# Patient Record
Sex: Female | Born: 1965 | Race: White | Hispanic: No | Marital: Married
Health system: Southern US, Community
[De-identification: ages and names within clinical notes are randomized; demographics above are authoritative.]

---

## 2021-01-07 ENCOUNTER — Emergency Department: Payer: 59

## 2021-01-07 ENCOUNTER — Other Ambulatory Visit: Payer: Self-pay

## 2021-01-07 DIAGNOSIS — I491 Atrial premature depolarization: Secondary | ICD-10-CM | POA: Diagnosis not present

## 2021-01-07 DIAGNOSIS — R946 Abnormal results of thyroid function studies: Secondary | ICD-10-CM | POA: Insufficient documentation

## 2021-01-07 DIAGNOSIS — R002 Palpitations: Secondary | ICD-10-CM | POA: Diagnosis present

## 2021-01-07 LAB — CBC
HCT: 38.2 % (ref 36.0–46.0)
Hemoglobin: 13.3 g/dL (ref 12.0–15.0)
MCH: 32.1 pg (ref 26.0–34.0)
MCHC: 34.8 g/dL (ref 30.0–36.0)
MCV: 92.3 fL (ref 80.0–100.0)
Platelets: 249 10*3/uL (ref 150–400)
RBC: 4.14 MIL/uL (ref 3.87–5.11)
RDW: 12.5 % (ref 11.5–15.5)
WBC: 8.4 10*3/uL (ref 4.0–10.5)
nRBC: 0 % (ref 0.0–0.2)

## 2021-01-07 NOTE — ED Triage Notes (Signed)
Pt states days of feeling palpitations and chest pressure. Pt states she has been lightheaded and dizzy. Pt states it is intermittent. Pt states when she has palpitations she does not notice that her heart rate is fast.

## 2021-01-08 ENCOUNTER — Emergency Department
Admission: EM | Admit: 2021-01-08 | Discharge: 2021-01-08 | Disposition: A | Discharge status: Home / Self Care | Payer: 59 | Attending: Emergency Medicine | Admitting: Emergency Medicine

## 2021-01-08 DIAGNOSIS — R002 Palpitations: Secondary | ICD-10-CM

## 2021-01-08 DIAGNOSIS — I491 Atrial premature depolarization: Secondary | ICD-10-CM

## 2021-01-08 DIAGNOSIS — R7989 Other specified abnormal findings of blood chemistry: Secondary | ICD-10-CM

## 2021-01-08 LAB — BASIC METABOLIC PANEL
Anion gap: 8 (ref 5–15)
BUN: 20 mg/dL (ref 6–20)
CO2: 26 mmol/L (ref 22–32)
Calcium: 9.5 mg/dL (ref 8.9–10.3)
Chloride: 104 mmol/L (ref 98–111)
Creatinine, Ser: 0.95 mg/dL (ref 0.44–1.00)
GFR, Estimated: >60 mL/min (ref >60)
Glucose, Bld: 116 mg/dL — ABNORMAL HIGH (ref 70–99)
Potassium: 4.1 mmol/L (ref 3.5–5.1)
Sodium: 138 mmol/L (ref 135–145)

## 2021-01-08 LAB — TSH: TSH: 10.432 u[IU]/mL — ABNORMAL HIGH (ref 0.350–4.500)

## 2021-01-08 LAB — TROPONIN I (HIGH SENSITIVITY)
Troponin I (High Sensitivity): 3 ng/L (ref <18)
Troponin I (High Sensitivity): 3 ng/L (ref <18)

## 2021-01-08 LAB — MAGNESIUM: Magnesium: 2.2 mg/dL (ref 1.7–2.4)

## 2021-01-08 LAB — T4, FREE: Free T4: 0.82 ng/dL (ref 0.61–1.12)

## 2021-01-08 NOTE — ED Notes (Signed)
Patient reports having palpitations for several days, states it doesn't feel like heart is racing but that it is beating really hard, only accompanying symptom is occasional dizziness that patient describes and things are just off.

## 2021-01-08 NOTE — ED Provider Notes (Signed)
[facility] Emergency Department Provider Note  ____________________________________________  Time seen: Approximately 3:05 AM  I have reviewed the triage vital signs and the nursing notes.   HISTORY  Chief Complaint Palpitations and Chest Pain   HPI Jane Herrera is a 55 y.o. female with no significant past medical history who presents for evaluation of palpitations.   Patient reports that she has been having the symptoms for several months but has not seek care.  She describes as a sensation of her heart pounding in her chest.  She reports that the heart does not beat fast just very strong.  Usually happens at nighttime while she is laying down to go to sleep.  It may last several hours.  She reports that recently has been happening almost on a nightly basis and it was pretty severe last night and tonight.  She reports that now she has some pain in her chest associated with it.  She reports that she feels like a heavy bag laying on top of her chest.  She also feels some pressure in her throat but not really shortness of breath.  During the day she says that sometimes she can have these episodes but they are usually more mild.  She denies any personal or family history of PE or DVT, recent travel immobilization, leg pain or swelling, hemoptysis, or exogenous hormones.  No cough or fever.  Patient is not a smoker.  She does report that she drinks a lot of caffeine on a daily basis.  Denies any alcohol use.  No history of smoking.  Denies any family history of cardiac dysrhythmias.  Mother does have a history of coronary artery disease. No syncope or dizziness. She does not feel that the symptoms are associated with her feeling stressed but her husband says that last weekend he was away with their kids and patient had a whole weekend just to herself in a very long time. Patient told him that she had a great weekend with none of the symptoms.  PMH None -  reviewed  Allergies Sulfa antibiotics and Xanax [alprazolam]  No family history on file.  Social History    Review of Systems  Constitutional: Negative for fever. Eyes: Negative for visual changes. ENT: Negative for sore throat. Neck: No neck pain  Cardiovascular: Negative for chest pain. + palpitations and heaviness of the chest Respiratory: Negative for shortness of breath. Gastrointestinal: Negative for abdominal pain, vomiting or diarrhea. Genitourinary: Negative for dysuria. Musculoskeletal: Negative for back pain. Skin: Negative for rash. Neurological: Negative for headaches, weakness or numbness. Psych: No SI or HI  ____________________________________________   PHYSICAL EXAM:  VITAL SIGNS: ED Triage Vitals  Enc Vitals Group     BP 01/07/21 2328 (!) 156/81     Pulse Rate 01/07/21 2328 66     Resp 01/07/21 2327 18     Temp 01/07/21 2328 98.6 F (37 C)     Temp Source 01/07/21 2328 Oral     SpO2 01/07/21 2328 99 %     Weight 01/07/21 2327 160 lb (72.6 kg)     Height 01/07/21 2327 5\' 4"  (1.626 m)     Head Circumference --      Peak Flow --      Pain Score --      Pain Loc --      Pain Edu? --      Excl. in GC? --     Constitutional: Alert and oriented. Well appearing and in  no apparent distress. HEENT:      Head: Normocephalic and atraumatic.         Eyes: Conjunctivae are normal. Sclera is non-icteric.       Mouth/Throat: Mucous membranes are moist.       Neck: Supple with no signs of meningismus. Cardiovascular: Regular rate and rhythm. No murmurs, gallops, or rubs. 2+ symmetrical distal pulses are present in all extremities. No JVD. Respiratory: Normal respiratory effort. Lungs are clear to auscultation bilaterally.  Gastrointestinal: Soft, non tender, and non distended. Musculoskeletal:  No edema, cyanosis, or erythema of extremities. Neurologic: Normal speech and language. Face is symmetric. Moving all extremities. No gross focal neurologic  deficits are appreciated. Skin: Skin is warm, dry and intact. No rash noted. Psychiatric: Mood and affect are normal. Speech and behavior are normal.  ____________________________________________   LABS (all labs ordered are listed, but only abnormal results are displayed)  Labs Reviewed  BASIC METABOLIC PANEL - Abnormal; Notable for the following components:      Result Value   Glucose, Bld 116 (*)    All other components within normal limits  TSH - Abnormal; Notable for the following components:   TSH 10.432 (*)    All other components within normal limits  CBC  MAGNESIUM  T4, FREE  TROPONIN I (HIGH SENSITIVITY)  TROPONIN I (HIGH SENSITIVITY)   ____________________________________________  EKG  ED ECG REPORT I, Nita Sickle, the attending physician, personally viewed and interpreted this ECG.  Sinus rhythm with frequent PACs, first-degree AV block, no ST elevations or depressions ____________________________________________  RADIOLOGY  I have personally reviewed the images performed during this visit and I agree with the Radiologist's read.   Interpretation by Radiologist:  DG Chest 2 View  Result Date: 01/07/2021 CLINICAL DATA:  Palpitations and chest pressure. EXAM: CHEST - 2 VIEW COMPARISON:  None. FINDINGS: The heart size and mediastinal contours are within normal limits. Both lungs are clear. The visualized skeletal structures are unremarkable. IMPRESSION: No active cardiopulmonary disease. Electronically Signed   By: Aram Candela M.D.   On: 01/07/2021 23:48     ____________________________________________   PROCEDURES  Procedure(s) performed:yes .1-3 Lead EKG Interpretation Performed by: Nita Sickle, MD Authorized by: Nita Sickle, MD     Interpretation: non-specific     ECG rate assessment: normal     Rhythm: sinus rhythm     Ectopy: PAC     Conduction: normal     Critical Care performed:   None ____________________________________________   INITIAL IMPRESSION / ASSESSMENT AND PLAN / ED COURSE  55 y.o. female with no significant past medical history who presents for evaluation of palpitations that she describes as feeling like her heart is pounding in her chest but not beating fast in the normal.  Associated with chest heaviness and some throat heaviness.  Patient is well-appearing and in no distress.  On telemetry she has frequent PACs.  EKG showing also frequent PACs with first-degree AV block.  No electrolyte derangements with normal K of 4.1, normal mag of 2.2.  No signs of cardiac ischemia both on EKG and with 2 high-sensitivity troponins.  No signs of dehydration, no signs of anemia.  TSH is very high at 10.432, free T4 is pending.  Plan to monitor patient on telemetry here and if no dysrhythmias were observed will discharge home with follow-up with cardiologist for Holter monitoring.  Differential diagnoses including dysrhythmias such as A. fib, a flutter, V. tach, PACs, PVCs versus anxiety/stress.  Low suspicion for PE  with no tachypnea, tachycardia, hypoxia, and intermittent symptoms.  History gathered from patient and her husband was at bedside.  Plan discussed with both of them.   _________________________ 3:40 AM on 01/08/2021 -----------------------------------------  Free T4 is normal.  Telemetry monitoring was reviewed showing several PACs but no other dysrhythmias.  At this time will recommend follow-up with cardiology for Holter monitoring.  Discussed elevated TSH with normal free T4 with patient recommended follow-up with her PCP for that.  Discussed my return precautions for any new or worsening palpitations, chest pain, or dizziness/syncope.    _____________________________________________ Please note:  Patient was evaluated in Emergency Department today for the symptoms described in the history of present illness. Patient was evaluated in the context of the global  COVID-19 pandemic, which necessitated consideration that the patient might be at risk for infection with the SARS-CoV-2 virus that causes COVID-19. Institutional protocols and algorithms that pertain to the evaluation of patients at risk for COVID-19 are in a state of rapid change based on information released by regulatory bodies including the CDC and federal and state organizations. These policies and algorithms were followed during the patient's care in the ED.  Some ED evaluations and interventions may be delayed as a result of limited staffing during the pandemic.   [place] Controlled Substance Database was reviewed by me. ____________________________________________   FINAL CLINICAL IMPRESSION(S) / ED DIAGNOSES   Final diagnoses:  Palpitations  PAC (premature atrial contraction)  Elevated TSH      NEW MEDICATIONS STARTED DURING THIS VISIT:  ED Discharge Orders    None       Note:  This document was prepared using Dragon voice recognition software and may include unintentional dictation errors.    Don Perking, [place], MD 01/08/21 [phone]

## 2021-01-08 NOTE — Discharge Instructions (Signed)
As we discussed your TSH hormone levels were slightly elevated but your thyroid hormones were normal.  Make sure to discuss that with your primary care doctor so he can be followed for that.  I am also recommending that you follow-up with a cardiologist as soon as possible for Holter monitoring to make sure that you are not having any malignant dysrhythmias.  In the meantime if you have worsening palpitations, if you feel dizzy or pass out while having palpitations or if you develop chest pain please return to the emergency room

## 2021-01-18 ENCOUNTER — Ambulatory Visit: Payer: 59 | Admitting: Nurse Practitioner

## 2021-05-31 ENCOUNTER — Ambulatory Visit: Payer: 59 | Admitting: Family Medicine

## 2021-07-06 ENCOUNTER — Other Ambulatory Visit: Payer: Self-pay | Admitting: Internal Medicine

## 2021-07-06 DIAGNOSIS — Z1231 Encounter for screening mammogram for malignant neoplasm of breast: Secondary | ICD-10-CM

## 2021-09-06 ENCOUNTER — Ambulatory Visit
Admission: RE | Admit: 2021-09-06 | Discharge: 2021-09-06 | Disposition: A | Discharge status: Home / Self Care | Payer: 59 | Source: Ambulatory Visit | Attending: Internal Medicine | Admitting: Internal Medicine

## 2021-09-06 ENCOUNTER — Other Ambulatory Visit: Payer: Self-pay

## 2021-09-06 DIAGNOSIS — Z1231 Encounter for screening mammogram for malignant neoplasm of breast: Secondary | ICD-10-CM | POA: Diagnosis not present

## 2021-09-07 ENCOUNTER — Inpatient Hospital Stay
Admission: RE | Admit: 2021-09-07 | Discharge: 2021-09-07 | Disposition: A | Discharge status: Home / Self Care | Payer: Self-pay | Source: Ambulatory Visit | Attending: *Deleted | Admitting: *Deleted

## 2021-09-07 ENCOUNTER — Other Ambulatory Visit: Payer: Self-pay | Admitting: *Deleted

## 2021-09-07 DIAGNOSIS — Z1231 Encounter for screening mammogram for malignant neoplasm of breast: Secondary | ICD-10-CM

## 2021-09-26 DIAGNOSIS — N816 Rectocele: Secondary | ICD-10-CM | POA: Diagnosis not present

## 2021-09-26 DIAGNOSIS — N814 Uterovaginal prolapse, unspecified: Secondary | ICD-10-CM | POA: Diagnosis not present

## 2021-09-26 DIAGNOSIS — N3941 Urge incontinence: Secondary | ICD-10-CM | POA: Diagnosis not present

## 2021-09-26 DIAGNOSIS — Z124 Encounter for screening for malignant neoplasm of cervix: Secondary | ICD-10-CM | POA: Diagnosis not present

## 2021-10-05 DIAGNOSIS — E039 Hypothyroidism, unspecified: Secondary | ICD-10-CM | POA: Diagnosis not present

## 2021-10-16 DIAGNOSIS — Z780 Asymptomatic menopausal state: Secondary | ICD-10-CM | POA: Diagnosis not present

## 2021-10-16 DIAGNOSIS — E7800 Pure hypercholesterolemia, unspecified: Secondary | ICD-10-CM | POA: Diagnosis not present

## 2021-10-16 DIAGNOSIS — R053 Chronic cough: Secondary | ICD-10-CM | POA: Diagnosis not present

## 2021-10-16 DIAGNOSIS — E039 Hypothyroidism, unspecified: Secondary | ICD-10-CM | POA: Diagnosis not present

## 2021-11-12 DIAGNOSIS — N8111 Cystocele, midline: Secondary | ICD-10-CM | POA: Diagnosis not present

## 2021-11-12 DIAGNOSIS — N3946 Mixed incontinence: Secondary | ICD-10-CM | POA: Diagnosis not present

## 2021-11-12 DIAGNOSIS — N816 Rectocele: Secondary | ICD-10-CM | POA: Diagnosis not present

## 2021-12-10 DIAGNOSIS — M5440 Lumbago with sciatica, unspecified side: Secondary | ICD-10-CM | POA: Diagnosis not present

## 2021-12-10 DIAGNOSIS — M5431 Sciatica, right side: Secondary | ICD-10-CM | POA: Diagnosis not present

## 2021-12-10 DIAGNOSIS — M5441 Lumbago with sciatica, right side: Secondary | ICD-10-CM | POA: Diagnosis not present

## 2021-12-10 DIAGNOSIS — M533 Sacrococcygeal disorders, not elsewhere classified: Secondary | ICD-10-CM | POA: Diagnosis not present

## 2021-12-10 DIAGNOSIS — M47816 Spondylosis without myelopathy or radiculopathy, lumbar region: Secondary | ICD-10-CM | POA: Diagnosis not present

## 2021-12-20 DIAGNOSIS — M5431 Sciatica, right side: Secondary | ICD-10-CM | POA: Diagnosis not present

## 2022-02-20 DIAGNOSIS — H8112 Benign paroxysmal vertigo, left ear: Secondary | ICD-10-CM | POA: Diagnosis not present

## 2022-02-25 DIAGNOSIS — N3946 Mixed incontinence: Secondary | ICD-10-CM | POA: Diagnosis not present

## 2022-03-02 DIAGNOSIS — N390 Urinary tract infection, site not specified: Secondary | ICD-10-CM | POA: Diagnosis not present

## 2022-03-02 DIAGNOSIS — R8290 Unspecified abnormal findings in urine: Secondary | ICD-10-CM | POA: Diagnosis not present

## 2022-03-02 DIAGNOSIS — R399 Unspecified symptoms and signs involving the genitourinary system: Secondary | ICD-10-CM | POA: Diagnosis not present

## 2022-03-02 DIAGNOSIS — R300 Dysuria: Secondary | ICD-10-CM | POA: Diagnosis not present

## 2022-03-02 DIAGNOSIS — R319 Hematuria, unspecified: Secondary | ICD-10-CM | POA: Diagnosis not present

## 2022-07-12 DIAGNOSIS — Z780 Asymptomatic menopausal state: Secondary | ICD-10-CM | POA: Diagnosis not present

## 2022-07-12 DIAGNOSIS — E7800 Pure hypercholesterolemia, unspecified: Secondary | ICD-10-CM | POA: Diagnosis not present

## 2022-07-12 DIAGNOSIS — E039 Hypothyroidism, unspecified: Secondary | ICD-10-CM | POA: Diagnosis not present

## 2022-07-19 DIAGNOSIS — M542 Cervicalgia: Secondary | ICD-10-CM | POA: Diagnosis not present

## 2022-07-19 DIAGNOSIS — M5441 Lumbago with sciatica, right side: Secondary | ICD-10-CM | POA: Diagnosis not present

## 2022-07-19 DIAGNOSIS — E7800 Pure hypercholesterolemia, unspecified: Secondary | ICD-10-CM | POA: Diagnosis not present

## 2022-07-19 DIAGNOSIS — E039 Hypothyroidism, unspecified: Secondary | ICD-10-CM | POA: Diagnosis not present

## 2022-07-19 DIAGNOSIS — G8929 Other chronic pain: Secondary | ICD-10-CM | POA: Diagnosis not present

## 2022-07-19 DIAGNOSIS — Z0000 Encounter for general adult medical examination without abnormal findings: Secondary | ICD-10-CM | POA: Diagnosis not present

## 2022-07-19 DIAGNOSIS — Z780 Asymptomatic menopausal state: Secondary | ICD-10-CM | POA: Diagnosis not present

## 2022-08-02 DIAGNOSIS — H18513 Endothelial corneal dystrophy, bilateral: Secondary | ICD-10-CM | POA: Diagnosis not present

## 2022-08-26 DIAGNOSIS — R768 Other specified abnormal immunological findings in serum: Secondary | ICD-10-CM | POA: Diagnosis not present

## 2022-10-28 DIAGNOSIS — H04123 Dry eye syndrome of bilateral lacrimal glands: Secondary | ICD-10-CM | POA: Diagnosis not present

## 2022-10-28 DIAGNOSIS — R768 Other specified abnormal immunological findings in serum: Secondary | ICD-10-CM | POA: Diagnosis not present

## 2022-10-28 DIAGNOSIS — Z832 Family history of diseases of the blood and blood-forming organs and certain disorders involving the immune mechanism: Secondary | ICD-10-CM | POA: Diagnosis not present

## 2022-10-28 DIAGNOSIS — M2550 Pain in unspecified joint: Secondary | ICD-10-CM | POA: Diagnosis not present

## 2022-10-28 DIAGNOSIS — R5382 Chronic fatigue, unspecified: Secondary | ICD-10-CM | POA: Diagnosis not present

## 2022-10-28 DIAGNOSIS — R682 Dry mouth, unspecified: Secondary | ICD-10-CM | POA: Diagnosis not present

## 2022-10-28 DIAGNOSIS — R21 Rash and other nonspecific skin eruption: Secondary | ICD-10-CM | POA: Diagnosis not present

## 2022-12-09 DIAGNOSIS — M2550 Pain in unspecified joint: Secondary | ICD-10-CM | POA: Diagnosis not present

## 2022-12-09 DIAGNOSIS — M329 Systemic lupus erythematosus, unspecified: Secondary | ICD-10-CM | POA: Diagnosis not present

## 2022-12-09 DIAGNOSIS — E039 Hypothyroidism, unspecified: Secondary | ICD-10-CM | POA: Diagnosis not present

## 2022-12-09 DIAGNOSIS — R768 Other specified abnormal immunological findings in serum: Secondary | ICD-10-CM | POA: Diagnosis not present

## 2023-02-26 DIAGNOSIS — L400 Psoriasis vulgaris: Secondary | ICD-10-CM | POA: Diagnosis not present

## 2023-02-26 DIAGNOSIS — L603 Nail dystrophy: Secondary | ICD-10-CM | POA: Diagnosis not present

## 2023-02-27 DIAGNOSIS — M359 Systemic involvement of connective tissue, unspecified: Secondary | ICD-10-CM | POA: Diagnosis not present

## 2023-02-27 DIAGNOSIS — E039 Hypothyroidism, unspecified: Secondary | ICD-10-CM | POA: Diagnosis not present

## 2023-02-27 DIAGNOSIS — M064 Inflammatory polyarthropathy: Secondary | ICD-10-CM | POA: Diagnosis not present

## 2023-04-15 ENCOUNTER — Other Ambulatory Visit: Payer: Self-pay | Admitting: Internal Medicine

## 2023-04-15 DIAGNOSIS — Z1211 Encounter for screening for malignant neoplasm of colon: Secondary | ICD-10-CM | POA: Diagnosis not present

## 2023-04-15 DIAGNOSIS — Z1231 Encounter for screening mammogram for malignant neoplasm of breast: Secondary | ICD-10-CM | POA: Diagnosis not present

## 2023-04-15 DIAGNOSIS — E7800 Pure hypercholesterolemia, unspecified: Secondary | ICD-10-CM | POA: Diagnosis not present

## 2023-04-15 DIAGNOSIS — L4050 Arthropathic psoriasis, unspecified: Secondary | ICD-10-CM | POA: Diagnosis not present

## 2023-04-15 DIAGNOSIS — E039 Hypothyroidism, unspecified: Secondary | ICD-10-CM | POA: Diagnosis not present

## 2023-04-16 DIAGNOSIS — L400 Psoriasis vulgaris: Secondary | ICD-10-CM | POA: Diagnosis not present

## 2023-04-16 DIAGNOSIS — R21 Rash and other nonspecific skin eruption: Secondary | ICD-10-CM | POA: Diagnosis not present

## 2023-04-16 DIAGNOSIS — L308 Other specified dermatitis: Secondary | ICD-10-CM | POA: Diagnosis not present

## 2023-04-18 DIAGNOSIS — Z79899 Other long term (current) drug therapy: Secondary | ICD-10-CM | POA: Diagnosis not present

## 2023-04-18 DIAGNOSIS — L4050 Arthropathic psoriasis, unspecified: Secondary | ICD-10-CM | POA: Diagnosis not present

## 2023-05-02 DIAGNOSIS — R899 Unspecified abnormal finding in specimens from other organs, systems and tissues: Secondary | ICD-10-CM | POA: Diagnosis not present

## 2023-05-06 LAB — EXTERNAL GENERIC LAB PROCEDURE

## 2023-05-23 DIAGNOSIS — R899 Unspecified abnormal finding in specimens from other organs, systems and tissues: Secondary | ICD-10-CM | POA: Diagnosis not present

## 2023-05-23 DIAGNOSIS — R748 Abnormal levels of other serum enzymes: Secondary | ICD-10-CM | POA: Diagnosis not present

## 2023-05-28 DIAGNOSIS — Z1211 Encounter for screening for malignant neoplasm of colon: Secondary | ICD-10-CM | POA: Diagnosis not present

## 2023-05-29 ENCOUNTER — Ambulatory Visit
Admission: RE | Admit: 2023-05-29 | Discharge: 2023-05-29 | Disposition: A | Discharge status: Home / Self Care | Payer: 59 | Source: Ambulatory Visit | Attending: Internal Medicine | Admitting: Internal Medicine

## 2023-05-29 DIAGNOSIS — Z1231 Encounter for screening mammogram for malignant neoplasm of breast: Secondary | ICD-10-CM | POA: Diagnosis not present

## 2023-06-06 DIAGNOSIS — M064 Inflammatory polyarthropathy: Secondary | ICD-10-CM | POA: Diagnosis not present

## 2023-06-06 LAB — COLOGUARD: COLOGUARD: NEGATIVE

## 2023-07-02 IMAGING — MG MM DIGITAL SCREENING BILAT W/ TOMO AND CAD
8 series · 8 of 24 positions shown · non-contrast
Comparison: Previous exam(s).

CLINICAL DATA: Screening.

EXAM:
DIGITAL SCREENING BILATERAL MAMMOGRAM WITH TOMOSYNTHESIS AND CAD
TECHNIQUE: Bilateral screening digital craniocaudal and mediolateral oblique
mammograms were obtained. Bilateral screening digital breast
tomosynthesis was performed. The images were evaluated with
computer-aided detection.

[R MLO synth-2D]
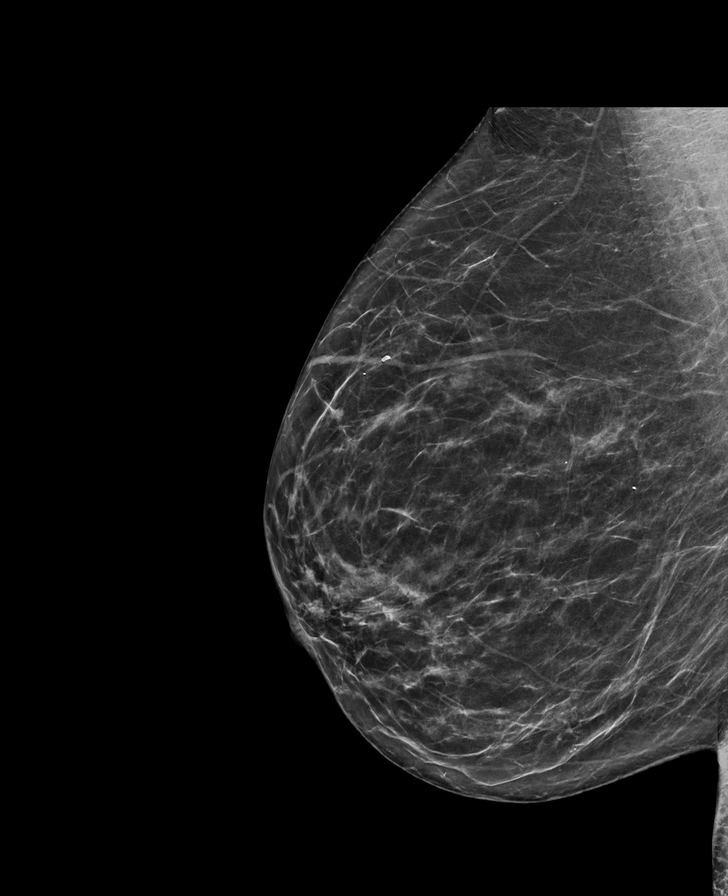

[L CC synth-2D]
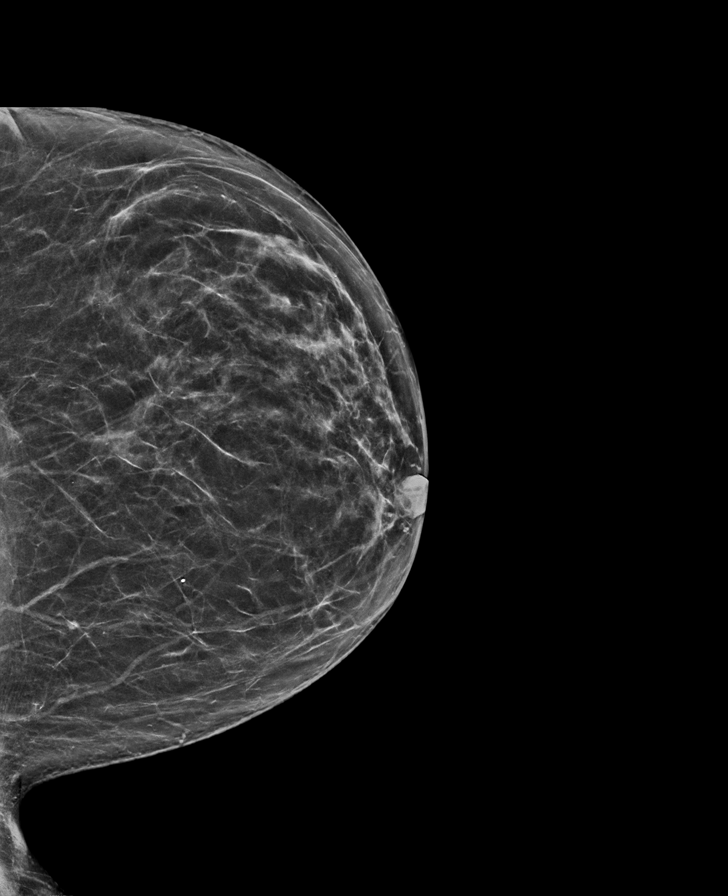

[L MLO synth-2D]
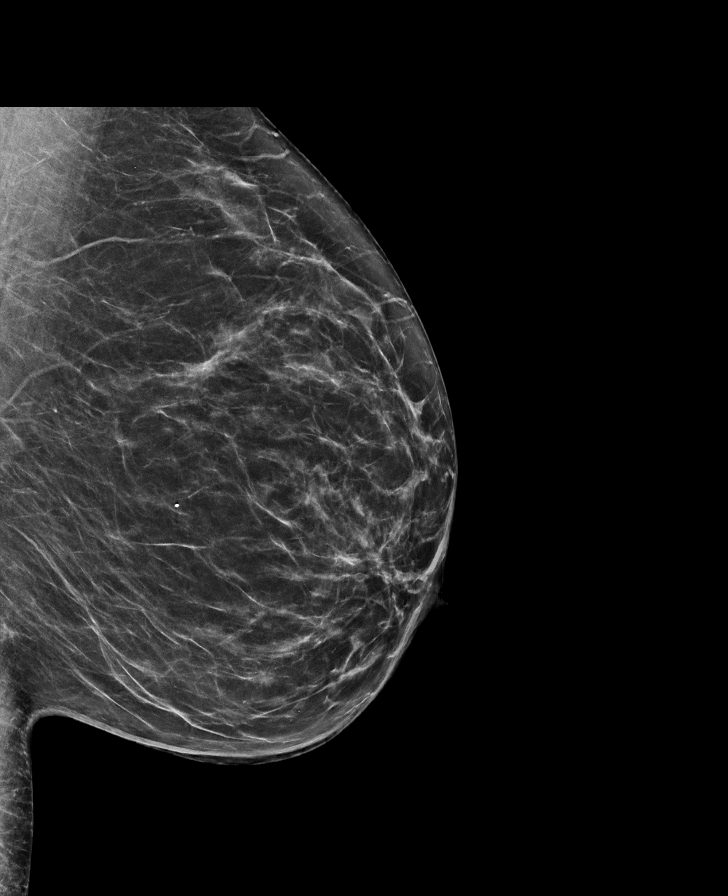

[R CC synth-2D]
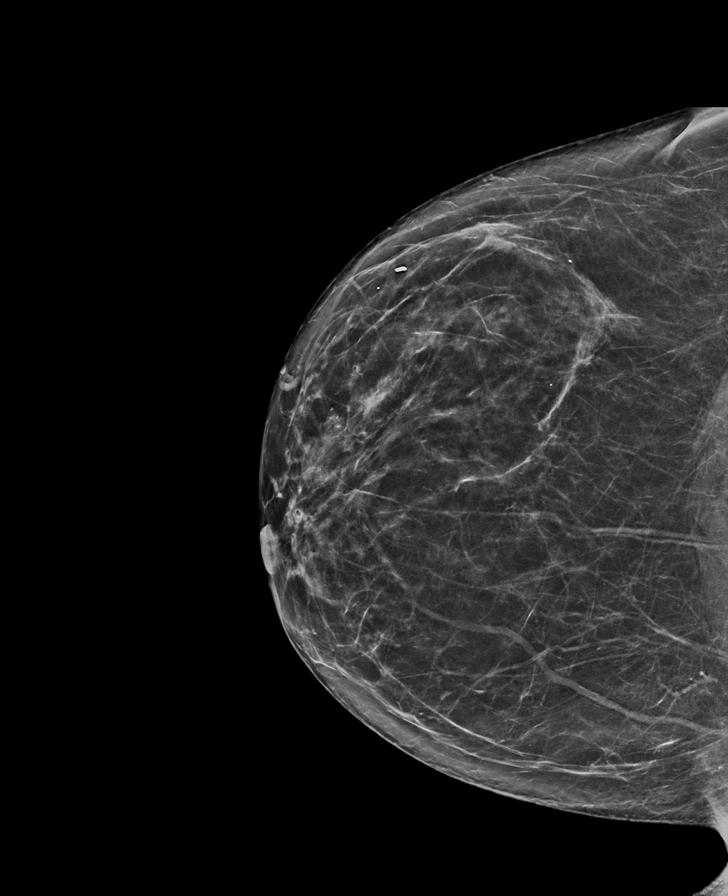

[L CC tomo · tomo slice 35/70.0]
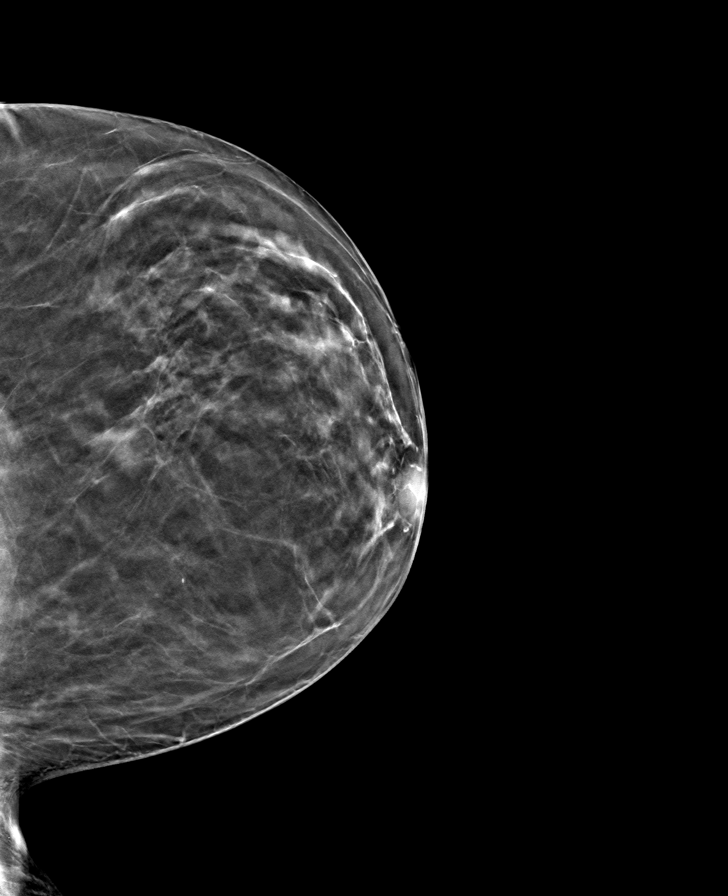

[R MLO tomo · tomo slice 37/72.0]
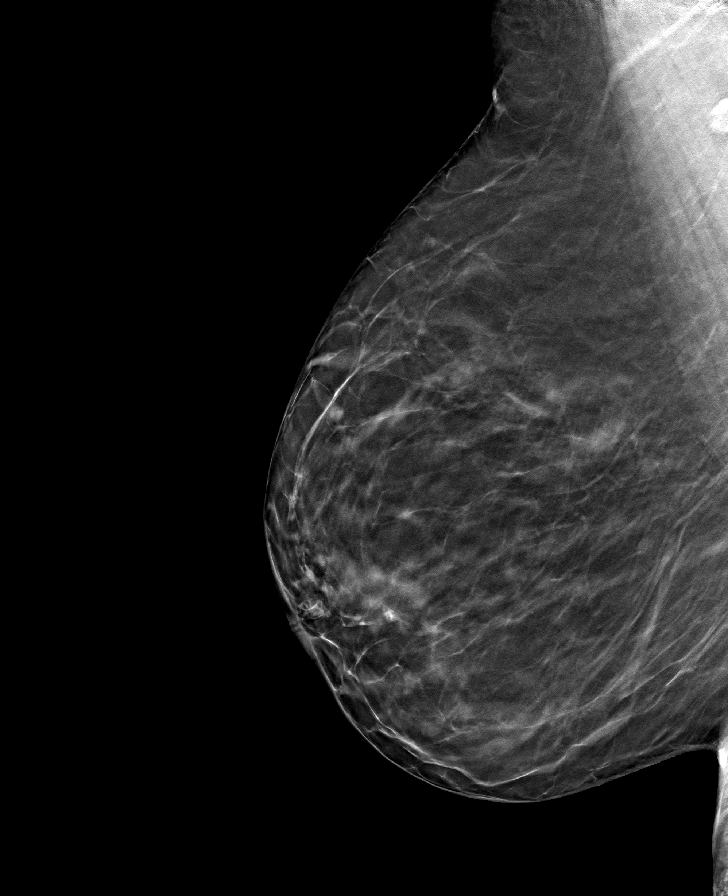

[L MLO tomo · tomo slice 37/72.0]
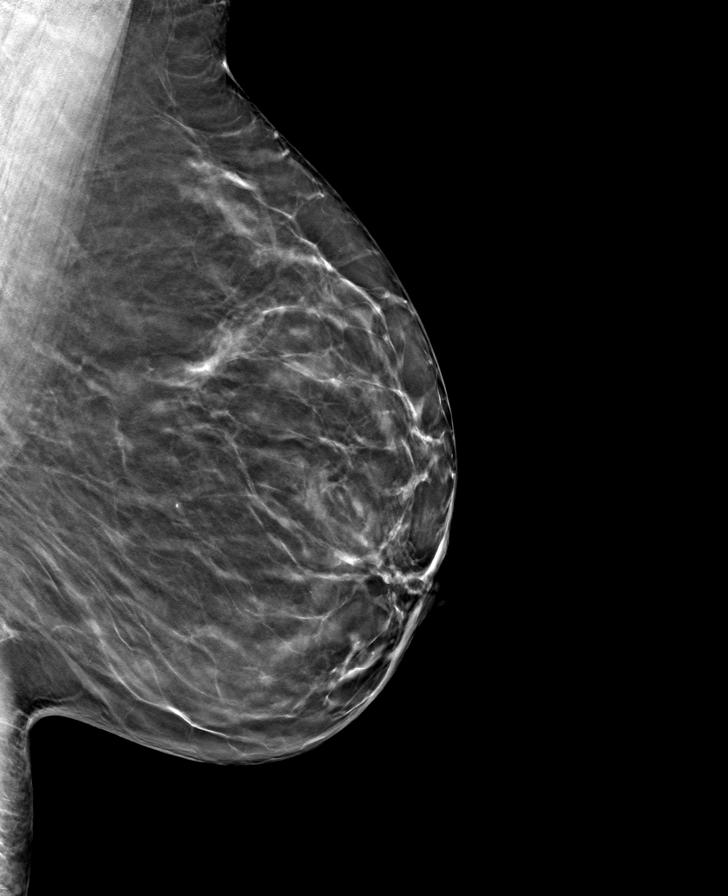

[R CC tomo · tomo slice 35/69.0]
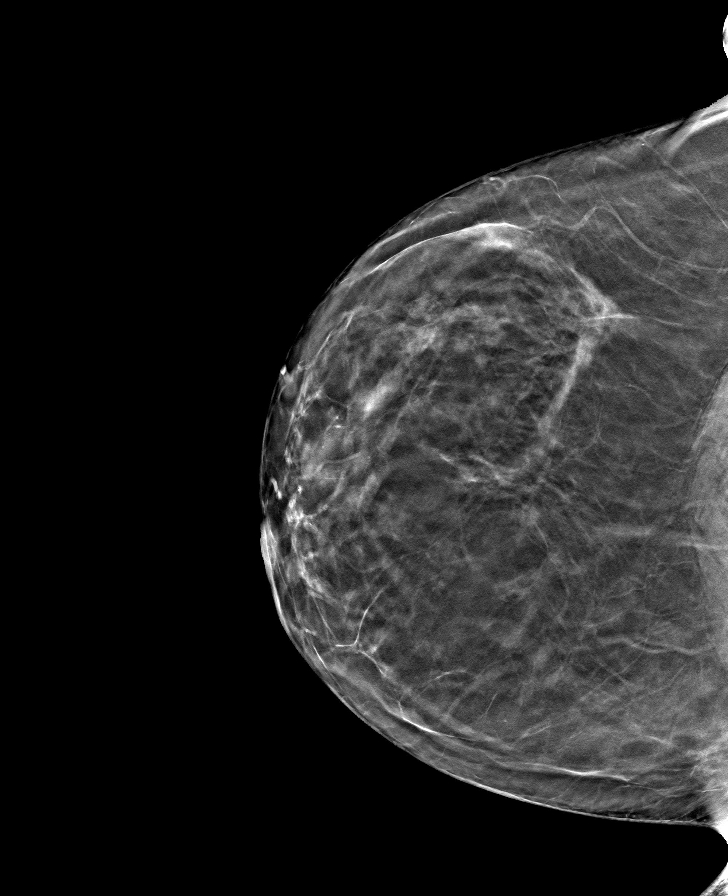

[8 of 24 positions shown; findings below may reference images not displayed]

ACR Breast Density Category b: There are scattered areas of
fibroglandular density.
FINDINGS: There are no findings suspicious for malignancy.
IMPRESSION: No mammographic evidence of malignancy. A result letter of this
screening mammogram will be mailed directly to the patient.

RECOMMENDATION:
Screening mammogram in one year. (Code:51-O-LD2)

BI-RADS CATEGORY  1: Negative.

## 2023-07-23 DIAGNOSIS — L4050 Arthropathic psoriasis, unspecified: Secondary | ICD-10-CM | POA: Diagnosis not present

## 2023-07-23 DIAGNOSIS — Z79899 Other long term (current) drug therapy: Secondary | ICD-10-CM | POA: Diagnosis not present

## 2023-07-23 DIAGNOSIS — R768 Other specified abnormal immunological findings in serum: Secondary | ICD-10-CM | POA: Diagnosis not present

## 2023-08-08 DIAGNOSIS — R899 Unspecified abnormal finding in specimens from other organs, systems and tissues: Secondary | ICD-10-CM | POA: Diagnosis not present

## 2023-09-16 DIAGNOSIS — M2550 Pain in unspecified joint: Secondary | ICD-10-CM | POA: Diagnosis not present

## 2023-09-16 DIAGNOSIS — Z0000 Encounter for general adult medical examination without abnormal findings: Secondary | ICD-10-CM | POA: Diagnosis not present

## 2023-09-16 DIAGNOSIS — L4050 Arthropathic psoriasis, unspecified: Secondary | ICD-10-CM | POA: Diagnosis not present

## 2023-09-16 DIAGNOSIS — E039 Hypothyroidism, unspecified: Secondary | ICD-10-CM | POA: Diagnosis not present

## 2023-09-16 DIAGNOSIS — E7800 Pure hypercholesterolemia, unspecified: Secondary | ICD-10-CM | POA: Diagnosis not present

## 2023-10-03 DIAGNOSIS — H2512 Age-related nuclear cataract, left eye: Secondary | ICD-10-CM | POA: Diagnosis not present

## 2023-10-03 DIAGNOSIS — H2511 Age-related nuclear cataract, right eye: Secondary | ICD-10-CM | POA: Diagnosis not present

## 2023-10-03 DIAGNOSIS — H04129 Dry eye syndrome of unspecified lacrimal gland: Secondary | ICD-10-CM | POA: Diagnosis not present

## 2023-10-03 DIAGNOSIS — H18513 Endothelial corneal dystrophy, bilateral: Secondary | ICD-10-CM | POA: Diagnosis not present

## 2023-10-17 DIAGNOSIS — L4050 Arthropathic psoriasis, unspecified: Secondary | ICD-10-CM | POA: Diagnosis not present

## 2023-10-17 DIAGNOSIS — E039 Hypothyroidism, unspecified: Secondary | ICD-10-CM | POA: Diagnosis not present

## 2023-10-17 DIAGNOSIS — Z0000 Encounter for general adult medical examination without abnormal findings: Secondary | ICD-10-CM | POA: Diagnosis not present

## 2023-10-17 DIAGNOSIS — M2550 Pain in unspecified joint: Secondary | ICD-10-CM | POA: Diagnosis not present

## 2023-10-17 DIAGNOSIS — Z789 Other specified health status: Secondary | ICD-10-CM | POA: Diagnosis not present

## 2023-10-23 DIAGNOSIS — L4050 Arthropathic psoriasis, unspecified: Secondary | ICD-10-CM | POA: Diagnosis not present

## 2023-10-23 DIAGNOSIS — Z79899 Other long term (current) drug therapy: Secondary | ICD-10-CM | POA: Diagnosis not present

## 2023-10-24 DIAGNOSIS — H25813 Combined forms of age-related cataract, bilateral: Secondary | ICD-10-CM | POA: Diagnosis not present

## 2023-10-24 DIAGNOSIS — H18513 Endothelial corneal dystrophy, bilateral: Secondary | ICD-10-CM | POA: Diagnosis not present

## 2023-12-04 DIAGNOSIS — H18512 Endothelial corneal dystrophy, left eye: Secondary | ICD-10-CM | POA: Diagnosis not present

## 2023-12-04 DIAGNOSIS — H2512 Age-related nuclear cataract, left eye: Secondary | ICD-10-CM | POA: Diagnosis not present

## 2023-12-04 DIAGNOSIS — H1820 Unspecified corneal edema: Secondary | ICD-10-CM | POA: Diagnosis not present

## 2024-01-06 DIAGNOSIS — Z79899 Other long term (current) drug therapy: Secondary | ICD-10-CM | POA: Diagnosis not present

## 2024-01-06 DIAGNOSIS — L4050 Arthropathic psoriasis, unspecified: Secondary | ICD-10-CM | POA: Diagnosis not present

## 2024-01-27 DIAGNOSIS — H25813 Combined forms of age-related cataract, bilateral: Secondary | ICD-10-CM | POA: Diagnosis not present

## 2024-01-27 DIAGNOSIS — H40042 Steroid responder, left eye: Secondary | ICD-10-CM | POA: Diagnosis not present

## 2024-01-27 DIAGNOSIS — H18513 Endothelial corneal dystrophy, bilateral: Secondary | ICD-10-CM | POA: Diagnosis not present

## 2024-01-27 DIAGNOSIS — Z947 Corneal transplant status: Secondary | ICD-10-CM | POA: Diagnosis not present

## 2024-02-03 DIAGNOSIS — H18513 Endothelial corneal dystrophy, bilateral: Secondary | ICD-10-CM | POA: Diagnosis not present

## 2024-02-03 DIAGNOSIS — Z947 Corneal transplant status: Secondary | ICD-10-CM | POA: Diagnosis not present

## 2024-02-25 DIAGNOSIS — Z79899 Other long term (current) drug therapy: Secondary | ICD-10-CM | POA: Diagnosis not present

## 2024-02-25 DIAGNOSIS — L4050 Arthropathic psoriasis, unspecified: Secondary | ICD-10-CM | POA: Diagnosis not present

## 2024-03-10 DIAGNOSIS — H18511 Endothelial corneal dystrophy, right eye: Secondary | ICD-10-CM | POA: Diagnosis not present

## 2024-03-10 DIAGNOSIS — H18519 Endothelial corneal dystrophy, unspecified eye: Secondary | ICD-10-CM | POA: Diagnosis not present

## 2024-03-10 DIAGNOSIS — H2511 Age-related nuclear cataract, right eye: Secondary | ICD-10-CM | POA: Diagnosis not present

## 2024-03-10 NOTE — Discharge Summary (Signed)
 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  Hospital Course: Surgery completed.  Discharge Exam:  Physical Exam: BP 113/70 (Patient Position: Sitting)   Temp 36.7 C (98.1 F) (Oral)   Resp 14   Ht 162.6 cm (5' 4)   Wt 70.1 kg (154 lb 8.7 oz)   SpO2 99%   BMI 26.53 kg/m  Constitutional: age appropriate, well developed, well appearing   Discharge Disposition:  Home  Allergies: Allergies  Allergen Reactions  . Alprazolam Other (See Comments)    makes me crazy Patient states this medication Makes me go absolutely crazy    . Sulfa (Sulfonamide Antibiotics) Other (See Comments)    Chills, fever, fatigue high fever     Patient Instructions:    Current Discharge Medication List     CONTINUE taking these medications       Instructions  brimonidine-timoloL 0.2-0.5 % ophthalmic solution Quantity: 5 mL Refills: 11  Commonly known as: COMBIGAN Place 1 drop into the left eye 2 (two) times daily   cholecalciferol 1000 unit capsule Refills: 0  Commonly known as: VITAMIN D3 Take 1,000 Units by mouth once daily   cyanocobalamin 500 MCG tablet Refills: 0  Commonly known as: VITAMIN B12 Take 500 mcg by mouth once daily   EnbreL SureClick 50 mg/mL (1 mL) pen injector Refills: 0 Generic drug: etanercept  Inject 50 mg subcutaneously once a week   ibuprofen 200 MG tablet Refills: 0  Commonly known as: MOTRIN Take 200 mg by mouth every 6 (six) hours as needed for Pain   ketorolac 0.5 % ophthalmic solution Quantity: 5 mL Refills: 0 Stop taking on: March 31, 2024  Commonly known as: Doctor, general practice 1 drop into the left eye 4 (four) times daily for 21 days   levothyroxine 50 MCG tablet Quantity: 90 tablet Refills: 1  Commonly known as: SYNTHROID Take 1 tablet (50 mcg total) by mouth once daily Take on an empty stomach with a glass of water at least 30-60 minutes before breakfast.   MAGNESIUM GLYCINATE ORAL Refills: 0  Take 2 Gum by mouth once daily   MAGNESIUM-POTASSIUM ORAL Refills: 0  Take by mouth   meloxicam 15 MG tablet Quantity: 30 tablet Refills: 1  Commonly known as: MOBIC Take 1 tablet (15 mg total) by mouth once daily   metoprolol TARTrate 25 MG tablet Quantity: 30 tablet Refills: 0  Commonly known as: LOPRESSOR Take 1 tablet (25 mg total) by mouth once daily as needed (palpitations)   naproxen sodium 220 mg Cap Refills: 0  Take by mouth every 8 (eight) hours as needed   ofloxacin 0.3 % ophthalmic solution Quantity: 5 mL Refills: 0  Stop taking on: March 17, 2024  Commonly known as: OCUFLOX Place 1 drop into the left eye 4 (four) times daily for 7 days   prednisoLONE acetate 1 % ophthalmic suspension Quantity: 10 mL Refills: 5  Commonly known as: PRED FORTE Place 1 drop  into the left eye every 1 hour While awake; starting after surgery   prednisoLONE acetate 1 % ophthalmic suspension Quantity: 10 mL Refills: 4  Commonly known as: PRED FORTE Place 1 drop into the left eye 4 (four) times daily       ASK your doctor about these medications      Instructions  diphenhydrAMINE 25 mg capsule Refills: 0  Commonly known as: BENADRYL Take 25 mg by mouth at bedtime as needed for Itching   leflunomide 10 MG tablet Quantity: 30 tablet Refills: 2  Commonly known as: ARAVA Take 1 tablet (10 mg total) by mouth once daily for 90 days   tiZANidine 2 MG tablet Quantity: 180 tablet Refills: 2  Commonly known as: ZANAFLEX Take between 1mg  (half a tablet) and 4mg  (two tablets) up to three times daily as needed for musculoskeletal tension and pain.         Activity:  Light activity. Shield the eye.  Diet: Resume normal diet.  Discharge Condition: Procedure tolerated well and ready for discharge  Results Pending at Discharge:  None  Follow-up Tests Recommended:  None  Follow-up/Transition Care Plan: Post-operative visit tomorrow morning as instructed.   CURTISTINE LOISE MAJESTIC, MD 03/10/2024

## 2024-03-16 DIAGNOSIS — E039 Hypothyroidism, unspecified: Secondary | ICD-10-CM | POA: Diagnosis not present

## 2024-03-16 DIAGNOSIS — L4050 Arthropathic psoriasis, unspecified: Secondary | ICD-10-CM | POA: Diagnosis not present

## 2024-03-16 DIAGNOSIS — E7800 Pure hypercholesterolemia, unspecified: Secondary | ICD-10-CM | POA: Diagnosis not present

## 2024-04-09 NOTE — Progress Notes (Signed)
 Chief Complaint  Patient presents with  . Psoriatic Arthritis    History of present illness:    Jane Herrera is a 58 y.o.female who presents today for follow up of psoriatic arthritis. She was last seen 02/25/2024 and was doing somewhat poorly. We noted mild to moderate breakthrough disease, although marked improvement with enbrel, and retried arava.  Jane Herrera was diagnosed with psoriatic arthritis in 2024 by Will DeFoor, PA-C. Diagnosis was supported by dermatology diagnosed psoriasis, inflammatory arthritis on exam.   Jane Herrera has tried the following rheumatologic medications in the past: - Plaquenil - discontinued in favor of methotrexate before it reached efficacy - Methotrexate - LFT elevation - Arava - current - Unable to take sulfasalazine due to sulfa allergy - Enbrel - current - Tizanidine - helpful prn but causes insomnia  She is doing quite a bit better since starting the arava 10mg  daily, noting she still hurts in a variety of locations but her energy and pain and stiffness have all begun improving. She notes ongoing pains in her hands, feet, elbows, mid-back between her shoulder blades, and her right SI joint area.   Her psoriasis is controlled.          Jane Herrera is currently being treated with enbrel and arava 10mg  daily.  She has not experienced side effects from the medication.  She has not had any infections since last office visit.    She has not had any other significant health changes or unexplained new symptoms since last visit.   Jane Herrera work status is voluntarily unemployed and keeps a hobby farm, previously a [employer]. She is a never smoker. She drinks an average of 0 servings of alcohol weekly. Jane Herrera reports a family history of triple positive antiphospholipid syndrome in her daughter who has a history of miscarriage, but denies any family history of known autoimmune disease otherwise.   Review of Systems ROS was negative except as noted  above.  Patient Active Problem List   Diagnosis Date Noted  . Family history of antiphospholipid syndrome 10/28/2022    Past Medical History:  Diagnosis Date  . Classic migraine   . Hypothyroidism   . PONV (postoperative nausea and vomiting)   . Prolapse of vaginal wall    Past Surgical History:  Procedure Laterality Date  . CORNEAL TRANSPLANT Left 12/04/2023   Procedure: L: DMEK-T -LEFT KERATOPLASTY, DMAEK / DMEK ENDOTHELIAL (CORNEAL TRANSPLANT);  Surgeon: Leonce Franky Seabrook, MD;  Location: [facility];  Service: Ophthalmology;  Laterality: Left;  . EXTRACTION CATARACT EXTRACAPSULAR W/INSERTION INTRAOCULAR PROSTHESIS Left 12/04/2023   Procedure: LEFT EXTRACAPSULAR CATARACT PHACO REMOVAL WITH INSERTION OF INTRAOCULAR LENS PROSTHESIS (1 STAGE PROCEDURE), MANUAL OR MECHANICAL TECHNIQUE; WITHOUT ENDOSCOPIC CYCLOPHOTOCOAGULATION;  Surgeon: Leonce Franky Seabrook, MD;  Location: [facility];  Service: Ophthalmology;  Laterality: Left;  . EXTRACTION CATARACT EXTRACAPSULAR W/INSERTION INTRAOCULAR PROSTHESIS Right 03/10/2024   Procedure: EXTRACTION CATARACT EXTRACAPSULAR WITH PHACO WITH INSERTION INTRAOCULAR PROSTHESIS;  Surgeon: Kuo, Anthony Nanlin, MD;  [facility];  Service: Ophthalmology;  Laterality: Right;  . CORNEAL TRANSPLANT DSAEK Right 03/10/2024   Procedure: KERATOPLASTY, DSAEK ENDOTHELIAL (CORNEAL TRANSPLANT);  Surgeon: Jonda Curtistine Mans, MD;  [facility];  Service: Ophthalmology;  Laterality: Right;  . No Past Surgeries     Social History   Socioeconomic History  . Marital status: Married  . Number of children: 4  Tobacco Use  . Smoking status: Never  . Smokeless tobacco: Never  Vaping Use  . Vaping status: Never Used  Substance  and Sexual Activity  . Alcohol use: Never  . Drug use: Never  . Sexual activity: Yes    Partners: Male   Social Drivers of Health   Financial Resource Strain: Low Risk  (09/16/2023)   Overall Financial Resource Strain  (CARDIA)   . Difficulty of Paying Living Expenses: Not hard at all  Food Insecurity: No Food Insecurity (09/16/2023)   Hunger Vital Sign   . Worried About Programme researcher, broadcasting/film/video in the Last Year: Never true   . Ran Out of Food in the Last Year: Never true  Transportation Needs: No Transportation Needs (09/16/2023)   PRAPARE - Transportation   . Lack of Transportation (Medical): No   . Lack of Transportation (Non-Medical): No  Housing Stability: Unknown (09/16/2023)   Housing Stability Vital Sign   . Unable to Pay for Housing in the Last Year: No   . Homeless in the Last Year: No   Family History  Problem Relation Name Age of Onset  . Vaginal prolapse Mother    . Stroke Father    . Breast cancer Neg Hx    . Ovarian cancer Neg Hx    . Glaucoma Neg Hx    . Macular degeneration Neg Hx      Physical Exam: BP 110/71   Pulse 77   Temp 36.3 C (97.3 F) (Temporal)   Ht 162.6 cm (5' 4)   Wt 70.8 kg (156 lb)   BMI 26.78 kg/m   General: Pleasant older white woman sitting in chair. Well groomed, no acute distress. Non-toxic appearance.  HEENT: Conjunctivae normal.  Pulmonary: Normal effort of breathing. Symmetric chest expansion.  Musculoskeletal: Tenderness to palpation of scattered MCPs and PIPs with S1 synovitis noted to right 4th  5th PIPs, bilateral brachioradiali, left elbow, left thoracic paraspinals, achilles tendons, MTPs. Probable MTP synovitis to left 3rd 4th. Fist formation mildly impaired bilaterally. Positive squeeze tests to hands and feet. No other tender, synovitic, warm, erythematous, or deformed joints. FROM all joints except as above.  Skin: Skin warm and dry. No rashes appreciable. Neurologic: Oriented to time, person, place, and situation.  Psychological: Normal behavior, thought content, and judgment. Records were reviewed Lab Results  Component Value Date/Time   SEDRATE 20 01/06/2024 11:48 AM   Lab Results  Component Value Date/Time   GLUCOSE 77 10/17/2023 10:01  AM   BUN 9 10/17/2023 10:01 AM   CREATININE 0.8 02/25/2024 04:32 PM   NA 139 10/17/2023 10:01 AM   K 4.4 10/17/2023 10:01 AM   CL 105 10/17/2023 10:01 AM   CO2 26.7 10/17/2023 10:01 AM   CALCIUM 9.9 10/17/2023 10:01 AM   ALB 4.6 02/25/2024 04:32 PM   AGRATIO 1.7 10/17/2023 10:01 AM   ALKPHOS 126 (H) 02/25/2024 04:32 PM   AST 27 02/25/2024 04:32 PM   ALT 20 02/25/2024 04:32 PM   Lab Results  Component Value Date/Time   WBC 6.2 02/25/2024 04:32 PM   RBC 4.47 02/25/2024 04:32 PM   HGB 14.5 02/25/2024 04:32 PM   HCT 41.9 02/25/2024 04:32 PM   MCV 93.7 02/25/2024 04:32 PM   MCH 32.4 (H) 02/25/2024 04:32 PM   MCHC 34.6 02/25/2024 04:32 PM   PLT 246 02/25/2024 04:32 PM    Assessment and Plan: Psoriatic arthritis (CMS/HHS-HCC)  (primary encounter diagnosis) Plan: CBC w/auto Differential (5 Part), Hepatic        Function Panel (HFP), Creatinine, C-Reactive        Protein, Quant - [facility], X-ray sacroiliac  joints 3 plus views, leflunomide (ARAVA) 10 MG        tablet, methocarbamoL (ROBAXIN) 500 MG tablet,        Sedimentation Rate-Westergren - [facility],        CANCELED: Sedimentation Rate-Automated  Chronic thoracic back pain, unspecified back pain laterality Plan: CANCELED: X-ray thoracic spine 3 views  Encounter for long-term (current) use of high-risk medication  1. Psoriatic arthritis-Moderate to severe.  Improved and Poorly controlled.  Ongoing improvement from enbrel, interval improvement from arava. We will continue her DMARD regimen and reassess efficacy in two months, with low threshold to either adjust enbrel or arava as needed. We will also try methocarbamol instead of tizanidine. Discussed potential risks, benefits, and adverse effects of methocarbamol including special considerations regarding safe driving. PT recommended for mid-back pain today and for now declined.   2. High risk prescription-This patient is on drug therapy requiring intensive monitoring for  toxicity, including regular lab monitoring and screening for serious or recurrent infections.  Today, I assessed for side effects, infections, new or worsening health conditions that may be related to the medications, and will obtain labs.  Return in about 2 months (around 06/15/2024).    Medication List      * Accurate as of April 15, 2024  5:10 PM. If you have any questions, ask your nurse or doctor.        START taking these medications   methocarbamoL 500 MG tablet Commonly known as: ROBAXIN Take 1 tablet (500 mg total) by mouth 3 (three) times daily as needed Started by: ELSIE CONCH DEFOOR, PA     CONTINUE taking these medications   brimonidine-timoloL 0.2-0.5 % ophthalmic solution Commonly known as: COMBIGAN Place 1 drop into the left eye 2 (two) times daily   cholecalciferol 1000 unit capsule Commonly known as: VITAMIN D3   cyanocobalamin 500 MCG tablet Commonly known as: VITAMIN B12   EnbreL SureClick 50 mg/mL (1 mL) pen injector Generic drug: etanercept   ibuprofen 200 MG tablet Commonly known as: MOTRIN   leflunomide 10 MG tablet Commonly known as: ARAVA Take 1 tablet (10 mg total) by mouth once daily for 90 days   levothyroxine 50 MCG tablet Commonly known as: SYNTHROID Take 1 tablet (50 mcg total) by mouth once daily Take on an empty stomach with a glass of water at least 30-60 minutes before breakfast.   MAGNESIUM GLYCINATE ORAL   meloxicam 15 MG tablet Commonly known as: MOBIC Take 1 tablet (15 mg total) by mouth once daily   metoprolol TARTrate 25 MG tablet Commonly known as: LOPRESSOR Take 1 tablet (25 mg total) by mouth once daily as needed (palpitations)   naproxen sodium 220 mg Cap   prednisoLONE acetate 1 % ophthalmic suspension Commonly known as: PRED FORTE Place 1 drop into the left eye every 1 hour While awake; starting after surgery       Where to Get Your Medications    These medications were sent to [id number]  - [place], [place] - [address]  [address] [place] [address]   Phone: [phone]  leflunomide 10 MG tablet methocarbamoL 500 MG tablet    Orders Placed This Encounter  Procedures  . X-ray sacroiliac joints 3 plus views  . CBC w/auto Differential (5 Part)  . Hepatic Function Panel (HFP)  . Creatinine  . C-Reactive Protein, Quant - [facility]  . Sedimentation Rate-Westergren - [facility]    All new prescription medications, changes in current prescription  dosages, and sample medications were discussed with the patient, including patient education, medication name, use, dosage, potential side effects, drug interactions, consequences of not using/taking, and special instructions.  Patient expressed understanding.  No barriers to adherence. *Some images could not be shown.

## 2024-04-15 DIAGNOSIS — M546 Pain in thoracic spine: Secondary | ICD-10-CM | POA: Diagnosis not present

## 2024-04-15 DIAGNOSIS — G8929 Other chronic pain: Secondary | ICD-10-CM | POA: Diagnosis not present

## 2024-04-15 DIAGNOSIS — Z79899 Other long term (current) drug therapy: Secondary | ICD-10-CM | POA: Diagnosis not present

## 2024-04-15 DIAGNOSIS — L4050 Arthropathic psoriasis, unspecified: Secondary | ICD-10-CM | POA: Diagnosis not present

## 2024-05-03 DIAGNOSIS — J340 Abscess, furuncle and carbuncle of nose: Secondary | ICD-10-CM | POA: Diagnosis not present

## 2024-05-03 NOTE — Progress Notes (Signed)
 Subjective:     Patient ID: Jane Herrera is a 58 y.o. female.  HPI: PRESENTS WITH 4 DAY HX OF SWELLING AND NOSE PAIN. NO INJURY. HAS A SORE INSIDE NOSE. HAS TAKEN TYLENOL AND ON PREDNISONE TAPER. NO FEVER. HAS HAD BEFORE. RECORDS REVIEWED.   Past Medical History:  has a past medical history of Classic migraine, History of cataract, Hypothyroidism, PONV (postoperative nausea and vomiting), and Prolapse of vaginal wall. Past Surgical History:  has a past surgical history that includes No Past Surgeries; corneal transplant (Left, 12/04/2023); extraction cataract extracapsular w/insertion intraocular prosthesis (Left, 12/04/2023); extraction cataract extracapsular w/insertion intraocular prosthesis (Right, 03/10/2024); and corneal transplant dsaek (Right, 03/10/2024). Family History: family history includes Stroke in her father; Vaginal prolapse in her mother. Social History:  reports that she has never smoked. She has never used smokeless tobacco. She reports that she does not drink alcohol and does not use drugs. Prior to encounter Medications:  Current Outpatient Medications on File Prior to Visit  Medication Sig Dispense Refill  . brimonidine-timoloL (COMBIGAN) 0.2-0.5 % ophthalmic solution Place 1 drop into the left eye 2 (two) times daily 5 mL 11  . cholecalciferol (VITAMIN D3) 1000 unit capsule Take 1,000 Units by mouth once daily    . cyanocobalamin (VITAMIN B12) 500 MCG tablet Take 500 mcg by mouth once daily    . ENBREL SURECLICK 50 mg/mL (1 mL) pen injector Inject 50 mg subcutaneously once a week    . ibuprofen (MOTRIN) 200 MG tablet Take 200 mg by mouth every 6 (six) hours as needed for Pain    . leflunomide (ARAVA) 10 MG tablet Take 1 tablet (10 mg total) by mouth once daily for 90 days 30 tablet 2  . levothyroxine (SYNTHROID) 50 MCG tablet Take 1 tablet (50 mcg total) by mouth once daily Take on an empty stomach with a glass of water at least 30-60 minutes before breakfast. 90 tablet 1  .  MAGNESIUM GLYCINATE ORAL Take 2 Gum by mouth once daily    . metoprolol tartrate (LOPRESSOR) 25 MG tablet Take 1 tablet (25 mg total) by mouth once daily as needed (palpitations) 30 tablet 0  . naproxen sodium 220 mg Cap Take by mouth every 8 (eight) hours as needed    . prednisoLONE acetate (PRED FORTE) 1 % ophthalmic suspension Place 1 drop into the left eye every 1 hour While awake; starting after surgery 10 mL 5  . predniSONE (DELTASONE) 10 MG tablet Take 3 tablets (30 mg total) by mouth once daily for 3 days, THEN 2 tablets (20 mg total) once daily for 3 days, THEN 1 tablet (10 mg total) once daily for 3 days. Take in the mornings. 18 tablet 0  . meloxicam (MOBIC) 15 MG tablet Take 1 tablet (15 mg total) by mouth once daily (Patient not taking: Reported on 05/03/2024) 30 tablet 1  . methocarbamoL (ROBAXIN) 500 MG tablet Take 1 tablet (500 mg total) by mouth 3 (three) times daily as needed (Patient not taking: Reported on 05/03/2024) 90 tablet 2   No current facility-administered medications on file prior to visit.   Allergies: is allergic to alprazolam and sulfa (sulfonamide antibiotics).  This an established patient is here today for: office visit.  Review of Systems     Objective:   Physical Exam Vitals reviewed.  Constitutional:      General: She is not in acute distress.    Appearance: Normal appearance. She is not ill-appearing or toxic-appearing.  HENT:  Head: Normocephalic and atraumatic.     Right Ear: Tympanic membrane and ear canal normal.     Left Ear: Tympanic membrane and ear canal normal.     Nose:     Comments: SWOLLEN AND VERY TENDER Cardiovascular:     Rate and Rhythm: Normal rate and regular rhythm.  Pulmonary:     Effort: Pulmonary effort is normal.     Breath sounds: Normal breath sounds.  Skin:    General: Skin is warm and dry.  Neurological:     Mental Status: She is alert.        Assessment:     1. Cellulitis of nose   -     doxycycline  (VIBRA-TABS) 100 MG tablet; Take 1 tablet (100 mg total) by mouth 2 (two) times daily for 10 days -     mupirocin (BACTROBAN) 2 % ointment; Apply topically 3 (three) times daily for 7 days -     oxyCODONE-acetaminophen (PERCOCET) 5-325 mg tablet; Take 1 tablet by mouth every 6 (six) hours as needed for Pain for up to 5 days     Plan:     Patient Instructions  Medications as directed To ER if symptoms worsen or not improved in 48 hrs

## 2024-05-04 ENCOUNTER — Emergency Department
Admission: EM | Admit: 2024-05-04 | Discharge: 2024-05-04 | Disposition: A | Discharge status: Home / Self Care | Attending: Emergency Medicine | Admitting: Emergency Medicine

## 2024-05-04 ENCOUNTER — Encounter: Payer: Self-pay | Admitting: Emergency Medicine

## 2024-05-04 ENCOUNTER — Other Ambulatory Visit: Payer: Self-pay

## 2024-05-04 DIAGNOSIS — D72829 Elevated white blood cell count, unspecified: Secondary | ICD-10-CM | POA: Insufficient documentation

## 2024-05-04 DIAGNOSIS — J340 Abscess, furuncle and carbuncle of nose: Secondary | ICD-10-CM | POA: Insufficient documentation

## 2024-05-04 DIAGNOSIS — R519 Headache, unspecified: Secondary | ICD-10-CM | POA: Diagnosis not present

## 2024-05-04 DIAGNOSIS — L03211 Cellulitis of face: Secondary | ICD-10-CM | POA: Diagnosis not present

## 2024-05-04 LAB — CBC WITH DIFFERENTIAL/PLATELET
Abs Immature Granulocytes: 0.05 K/uL (ref 0.00–0.07)
Basophils Absolute: 0.1 K/uL (ref 0.0–0.1)
Basophils Relative: 1 %
Eosinophils Absolute: 0 K/uL (ref 0.0–0.5)
Eosinophils Relative: 0 %
HCT: 39.1 % (ref 36.0–46.0)
Hemoglobin: 13.5 g/dL (ref 12.0–15.0)
Immature Granulocytes: 0 %
Lymphocytes Relative: 18 %
Lymphs Abs: 2.3 K/uL (ref 0.7–4.0)
MCH: 32.5 pg (ref 26.0–34.0)
MCHC: 34.5 g/dL (ref 30.0–36.0)
MCV: 94 fL (ref 80.0–100.0)
Monocytes Absolute: 1.4 K/uL — ABNORMAL HIGH (ref 0.1–1.0)
Monocytes Relative: 12 %
Neutro Abs: 8.6 K/uL — ABNORMAL HIGH (ref 1.7–7.7)
Neutrophils Relative %: 69 %
Platelets: 256 K/uL (ref 150–400)
RBC: 4.16 MIL/uL (ref 3.87–5.11)
RDW: 12.2 % (ref 11.5–15.5)
WBC: 12.5 K/uL — ABNORMAL HIGH (ref 4.0–10.5)
nRBC: 0 % (ref 0.0–0.2)

## 2024-05-04 LAB — BASIC METABOLIC PANEL WITH GFR
Anion gap: 9 (ref 5–15)
BUN: 14 mg/dL (ref 6–20)
CO2: 28 mmol/L (ref 22–32)
Calcium: 9.5 mg/dL (ref 8.9–10.3)
Chloride: 102 mmol/L (ref 98–111)
Creatinine, Ser: 0.9 mg/dL (ref 0.44–1.00)
GFR, Estimated: >60 mL/min (ref >60)
Glucose, Bld: 112 mg/dL — ABNORMAL HIGH (ref 70–99)
Potassium: 3.9 mmol/L (ref 3.5–5.1)
Sodium: 139 mmol/L (ref 135–145)

## 2024-05-04 MED ORDER — ACETAMINOPHEN 500 MG PO TABS
1000.0000 mg | ORAL_TABLET | Freq: Once | ORAL | Status: AC
  Administered 2024-05-04: 1000 mg via ORAL
  Filled 2024-05-04: qty 2

## 2024-05-04 MED ORDER — CEPHALEXIN 500 MG PO CAPS
500.0000 mg | ORAL_CAPSULE | Freq: Once | ORAL | Status: AC
  Administered 2024-05-04: 500 mg via ORAL
  Filled 2024-05-04: qty 1

## 2024-05-04 MED ORDER — CEPHALEXIN 500 MG PO CAPS: 500.0000 mg | ORAL_CAPSULE | Freq: Four times a day (QID) | ORAL | 0 refills | Status: AC

## 2024-05-04 NOTE — ED Provider Notes (Signed)
 [facility] Provider Note    Event Date/Time   First MD Initiated Contact with Patient 05/04/24 1054     (approximate)   History   Fever   HPI  Jane Herrera is a 58 y.o. female with history of psoriatic arthritis, chronic back pain, presenting with headache and nasal swelling.  Patient states that she noticed nasal swelling and some redness for the last several days.  Went to see primary care yesterday who started her on doxycycline for concerns of nasal cellulitis.  States that the swelling to her nose is improving since she started the doxycycline, is on her third dose currently.  States that she had a temperature of nine 99.4 at home, noticed a mild frontal headache, was unsure if this was related to her symptoms and so presented here to be seen.  She denies any neck stiffness, nuchal rigidity, weakness or numbness or slurred speech, denies any intraoral lesions, did notice some left-sided cervical lymphadenopathy.  She denies any sore throat, no proptosis, no visual changes, no eye pain.  No facial swelling.    On independent chart review, she was seen by primary care yesterday for 4 days of nose pain and swelling, no injury or sores, on prednisone taper, was placed on doxycycline for cellulitis.  Physical Exam   Triage Vital Signs: ED Triage Vitals  Encounter Vitals Group     BP 05/04/24 1049 137/74     Girls Systolic BP Percentile --      Girls Diastolic BP Percentile --      Boys Systolic BP Percentile --      Boys Diastolic BP Percentile --      Pulse Rate 05/04/24 1049 78     Resp 05/04/24 1049 17     Temp 05/04/24 1049 98.3 F (36.8 C)     Temp Source 05/04/24 1049 Oral     SpO2 05/04/24 1049 95 %     Weight 05/04/24 1050 155 lb (70.3 kg)     Height 05/04/24 1050 5' 4.5 (1.638 m)     Head Circumference --      Peak Flow --      Pain Score 05/04/24 1049 2     Pain Loc --      Pain Education --      Exclude from Growth Chart --      Most recent vital signs: Vitals:   05/04/24 1049  BP: 137/74  Pulse: 78  Resp: 17  Temp: 98.3 F (36.8 C)  SpO2: 95%     General: Awake, no distress.  CV:  Good peripheral perfusion.  Resp:  Normal effort.  Abd:  No distention.  Other:  Pupils are equal reactive, extraocular movements are intact, no cranial nerve deficits, no proptosis, no periorbital swelling, she does have nasal swelling just at the tip of her nose, she does have mild yellow drainage at the left nare just at the tip of the nose with some erythema, further up in her nasal passages, there is no swelling, erythema.  Clear oropharynx, she has some left cervical lymphadenopathy   ED Results / Procedures / Treatments   Labs (all labs ordered are listed, but only abnormal results are displayed) Labs Reviewed  CBC WITH DIFFERENTIAL/PLATELET - Abnormal; Notable for the following components:      Result Value   WBC 12.5 (*)    Neutro Abs 8.6 (*)    Monocytes Absolute 1.4 (*)    All other components within  normal limits  BASIC METABOLIC PANEL WITH GFR - Abnormal; Notable for the following components:   Glucose, Bld 112 (*)    All other components within normal limits      PROCEDURES:  Critical Care performed: No  Procedures   MEDICATIONS ORDERED IN ED: Medications  acetaminophen (TYLENOL) tablet 1,000 mg (1,000 mg Oral Given 05/04/24 1115)  cephALEXin (KEFLEX) capsule 500 mg (500 mg Oral Given 05/04/24 1116)     IMPRESSION / MDM / ASSESSMENT AND PLAN / ED COURSE  I reviewed the triage vital signs and the nursing notes.                              Differential diagnosis includes, but is not limited to, nasal cellulitis, possible early abscess, considered but doubt deep space infection or extending infection at this time given that she has no focal deficits on exam, no proptosis, no vision changes, no nuchal rigidity, she is nontoxic-appearing, afebrile.  Will give her some Tylenol here, will add  Keflex to her regimen, will give her a number to call for ENT to schedule follow-up.  Labs were obtained out of triage.  Patient's presentation is most consistent with acute presentation with potential threat to life or bodily function.  Independent interpretation of labs below.  Labs are reassuring, considered but no indication for inpatient admission at this time, she is here for outpatient management.  Will discharge with strict return precautions.  Will add Keflex to her regimen, first dose given in the emergency department.  Will also give her number to call for ENT to schedule follow-up.  Shared decision making to patient and she is agreeable with this plan.  Discharge.    Clinical Course as of 05/04/24 1126  Tue May 04, 2024  1125 Independent review of labs, mild leukocytosis, electrolytes not severely deranged. [TT]    Clinical Course User Index [TT] Waymond Lorelle Cummins, MD     FINAL CLINICAL IMPRESSION(S) / ED DIAGNOSES   Final diagnoses:  Cellulitis of nasal tip  Nonintractable headache, unspecified chronicity pattern, unspecified headache type     Rx / DC Orders   ED Discharge Orders          Ordered    cephALEXin (KEFLEX) 500 MG capsule  4 times daily        05/04/24 1116             Note:  This document was prepared using Dragon voice recognition software and may include unintentional dictation errors.    Waymond Lorelle Cummins, MD 05/04/24 [phone]

## 2024-05-04 NOTE — ED Triage Notes (Signed)
 Patient to ED via POV for fever and headache. Started today. On antibiotics for cellulitis in her nose. PT reports being immunocompromised.

## 2024-05-04 NOTE — Discharge Instructions (Addendum)
 Please take the Keflex in addition to your doxycycline for your nasal cellulitis.  I put in a number for you to call to schedule follow-up with ENT.

## 2024-06-15 DIAGNOSIS — L4050 Arthropathic psoriasis, unspecified: Secondary | ICD-10-CM | POA: Diagnosis not present

## 2024-06-15 DIAGNOSIS — Z79899 Other long term (current) drug therapy: Secondary | ICD-10-CM | POA: Diagnosis not present

## 2024-06-15 DIAGNOSIS — G8929 Other chronic pain: Secondary | ICD-10-CM | POA: Diagnosis not present

## 2024-06-15 DIAGNOSIS — M542 Cervicalgia: Secondary | ICD-10-CM | POA: Diagnosis not present

## 2024-07-20 DIAGNOSIS — Z947 Corneal transplant status: Secondary | ICD-10-CM | POA: Diagnosis not present

## 2024-07-20 DIAGNOSIS — H40042 Steroid responder, left eye: Secondary | ICD-10-CM | POA: Diagnosis not present
